# Patient Record
Sex: Female | Born: 1935 | Race: White | Hispanic: No | Marital: Married
Health system: Southern US, Community
[De-identification: ages and names within clinical notes are randomized; demographics above are authoritative.]

---

## 2005-06-16 ENCOUNTER — Ambulatory Visit: Payer: Self-pay | Admitting: Ophthalmology

## 2005-08-06 ENCOUNTER — Ambulatory Visit: Payer: Self-pay | Admitting: Ophthalmology

## 2006-05-05 ENCOUNTER — Ambulatory Visit: Payer: Self-pay | Admitting: Gastroenterology

## 2006-06-18 ENCOUNTER — Ambulatory Visit: Payer: Self-pay | Admitting: Internal Medicine

## 2010-01-20 ENCOUNTER — Emergency Department: Payer: Self-pay | Admitting: Emergency Medicine

## 2010-04-07 ENCOUNTER — Inpatient Hospital Stay: Payer: Self-pay | Admitting: Surgery

## 2010-04-11 ENCOUNTER — Encounter: Payer: Self-pay | Admitting: Internal Medicine

## 2010-05-11 ENCOUNTER — Encounter: Payer: Self-pay | Admitting: Internal Medicine

## 2012-12-21 ENCOUNTER — Ambulatory Visit: Payer: Self-pay

## 2012-12-22 ENCOUNTER — Ambulatory Visit: Payer: Self-pay | Admitting: Orthopedic Surgery

## 2012-12-22 LAB — BASIC METABOLIC PANEL
BUN: 17 mg/dL (ref 7–18)
Chloride: 107 mmol/L (ref 98–107)
Co2: 33 mmol/L — ABNORMAL HIGH (ref 21–32)
Creatinine: 0.7 mg/dL (ref 0.60–1.30)
EGFR (African American): >60
EGFR (Non-African Amer.): >60
Sodium: 142 mmol/L (ref 136–145)

## 2012-12-22 LAB — HEMOGLOBIN: HGB: 15.4 g/dL (ref 12.0–16.0)

## 2012-12-24 ENCOUNTER — Ambulatory Visit: Payer: Self-pay | Admitting: Orthopedic Surgery

## 2013-11-20 ENCOUNTER — Inpatient Hospital Stay: Payer: Self-pay | Admitting: Internal Medicine

## 2013-11-20 LAB — CBC WITH DIFFERENTIAL/PLATELET
Basophil #: 0 10*3/uL (ref 0.0–0.1)
Basophil %: 0.1 %
EOS ABS: 0 10*3/uL (ref 0.0–0.7)
Eosinophil %: 0 %
HCT: 45.6 % (ref 35.0–47.0)
HGB: 14.8 g/dL (ref 12.0–16.0)
LYMPHS ABS: 0.5 10*3/uL — AB (ref 1.0–3.6)
Lymphocyte %: 3.4 %
MCH: 30.7 pg (ref 26.0–34.0)
MCHC: 32.5 g/dL (ref 32.0–36.0)
MCV: 95 fL (ref 80–100)
Monocyte #: 0.6 x10 3/mm (ref 0.2–0.9)
Monocyte %: 4.4 %
NEUTROS PCT: 92.1 %
Neutrophil #: 13.5 10*3/uL — ABNORMAL HIGH (ref 1.4–6.5)
PLATELETS: 184 10*3/uL (ref 150–440)
RBC: 4.82 10*6/uL (ref 3.80–5.20)
RDW: 13.6 % (ref 11.5–14.5)
WBC: 14.7 10*3/uL — ABNORMAL HIGH (ref 3.6–11.0)

## 2013-11-20 LAB — CK-MB
CK-MB: 32.5 ng/mL — ABNORMAL HIGH (ref 0.5–3.6)
CK-MB: 33.3 ng/mL — ABNORMAL HIGH (ref 0.5–3.6)

## 2013-11-20 LAB — URINALYSIS, COMPLETE
Glucose,UR: NEGATIVE mg/dL (ref 0–75)
Hyaline Cast: 2
Leukocyte Esterase: NEGATIVE
NITRITE: NEGATIVE
PH: 5 (ref 4.5–8.0)
RBC,UR: 4 /HPF (ref 0–5)
Specific Gravity: 1.02 (ref 1.003–1.030)
Squamous Epithelial: 2

## 2013-11-20 LAB — PROTIME-INR
INR: 1
Prothrombin Time: 12.8 secs (ref 11.5–14.7)

## 2013-11-20 LAB — APTT
ACTIVATED PTT: 30.7 s (ref 23.6–35.9)
Activated PTT: 74.8 secs — ABNORMAL HIGH (ref 23.6–35.9)

## 2013-11-20 LAB — COMPREHENSIVE METABOLIC PANEL
ALBUMIN: 3.8 g/dL (ref 3.4–5.0)
ALK PHOS: 70 U/L
ALT: 24 U/L (ref 12–78)
ANION GAP: 8 (ref 7–16)
BILIRUBIN TOTAL: 1.4 mg/dL — AB (ref 0.2–1.0)
BUN: 23 mg/dL — ABNORMAL HIGH (ref 7–18)
CALCIUM: 8.6 mg/dL (ref 8.5–10.1)
CREATININE: 1.12 mg/dL (ref 0.60–1.30)
Chloride: 104 mmol/L (ref 98–107)
Co2: 28 mmol/L (ref 21–32)
EGFR (African American): 50 — ABNORMAL LOW
GFR CALC NON AF AMER: 43 — AB
GLUCOSE: 102 mg/dL — AB (ref 65–99)
Osmolality: 283 (ref 275–301)
Potassium: 3.6 mmol/L (ref 3.5–5.1)
SGOT(AST): 73 U/L — ABNORMAL HIGH (ref 15–37)
Sodium: 140 mmol/L (ref 136–145)
TOTAL PROTEIN: 7.7 g/dL (ref 6.4–8.2)

## 2013-11-20 LAB — CK TOTAL AND CKMB (NOT AT ARMC)
CK, TOTAL: 2392 U/L — AB
CK-MB: 31.7 ng/mL — ABNORMAL HIGH (ref 0.5–3.6)

## 2013-11-20 LAB — TROPONIN I
TROPONIN-I: 1.2 ng/mL — AB
TROPONIN-I: 1.8 ng/mL — AB
TROPONIN-I: 2.5 ng/mL — AB

## 2013-11-20 LAB — LIPASE, BLOOD: Lipase: 112 U/L (ref 73–393)

## 2013-11-20 LAB — MAGNESIUM: MAGNESIUM: 2.1 mg/dL

## 2013-11-21 LAB — CK: CK, Total: 2260 U/L — ABNORMAL HIGH

## 2013-11-21 LAB — CBC WITH DIFFERENTIAL/PLATELET
BASOS ABS: 0.1 10*3/uL (ref 0.0–0.1)
BASOS PCT: 0.9 %
EOS ABS: 0 10*3/uL (ref 0.0–0.7)
EOS PCT: 0.3 %
HCT: 39 % (ref 35.0–47.0)
HGB: 13.3 g/dL (ref 12.0–16.0)
Lymphocyte #: 1.4 10*3/uL (ref 1.0–3.6)
Lymphocyte %: 9.7 %
MCH: 31.8 pg (ref 26.0–34.0)
MCHC: 34.1 g/dL (ref 32.0–36.0)
MCV: 93 fL (ref 80–100)
MONOS PCT: 6.3 %
Monocyte #: 0.9 x10 3/mm (ref 0.2–0.9)
Neutrophil #: 11.5 10*3/uL — ABNORMAL HIGH (ref 1.4–6.5)
Neutrophil %: 82.8 %
Platelet: 171 10*3/uL (ref 150–440)
RBC: 4.19 10*6/uL (ref 3.80–5.20)
RDW: 13.7 % (ref 11.5–14.5)
WBC: 13.9 10*3/uL — AB (ref 3.6–11.0)

## 2013-11-21 LAB — BASIC METABOLIC PANEL
ANION GAP: 7 (ref 7–16)
BUN: 25 mg/dL — AB (ref 7–18)
CHLORIDE: 107 mmol/L (ref 98–107)
Calcium, Total: 8 mg/dL — ABNORMAL LOW (ref 8.5–10.1)
Co2: 27 mmol/L (ref 21–32)
Creatinine: 1.17 mg/dL (ref 0.60–1.30)
GFR CALC AF AMER: 47 — AB
GFR CALC NON AF AMER: 41 — AB
GLUCOSE: 98 mg/dL (ref 65–99)
OSMOLALITY: 286 (ref 275–301)
Potassium: 3.7 mmol/L (ref 3.5–5.1)
SODIUM: 141 mmol/L (ref 136–145)

## 2013-11-21 LAB — APTT: ACTIVATED PTT: 77.4 s — AB (ref 23.6–35.9)

## 2013-11-21 LAB — LIPID PANEL
Cholesterol: 154 mg/dL (ref 0–200)
HDL Cholesterol: 45 mg/dL (ref 40–60)
Ldl Cholesterol, Calc: 91 mg/dL (ref 0–100)
TRIGLYCERIDES: 91 mg/dL (ref 0–200)
VLDL Cholesterol, Calc: 18 mg/dL (ref 5–40)

## 2013-11-21 LAB — TSH: THYROID STIMULATING HORM: 1.03 u[IU]/mL

## 2013-11-22 LAB — BASIC METABOLIC PANEL
Anion Gap: 5 — ABNORMAL LOW (ref 7–16)
BUN: 20 mg/dL — AB (ref 7–18)
Calcium, Total: 7.8 mg/dL — ABNORMAL LOW (ref 8.5–10.1)
Chloride: 110 mmol/L — ABNORMAL HIGH (ref 98–107)
Co2: 27 mmol/L (ref 21–32)
Creatinine: 1.1 mg/dL (ref 0.60–1.30)
GFR CALC AF AMER: 51 — AB
GFR CALC NON AF AMER: 44 — AB
GLUCOSE: 103 mg/dL — AB (ref 65–99)
Osmolality: 286 (ref 275–301)
Potassium: 3.6 mmol/L (ref 3.5–5.1)
Sodium: 142 mmol/L (ref 136–145)

## 2013-11-22 LAB — CBC WITH DIFFERENTIAL/PLATELET
Basophil #: 0.1 10*3/uL (ref 0.0–0.1)
Basophil %: 0.5 %
Eosinophil #: 0.2 10*3/uL (ref 0.0–0.7)
Eosinophil %: 1.8 %
HCT: 37.4 % (ref 35.0–47.0)
HGB: 12.3 g/dL (ref 12.0–16.0)
Lymphocyte #: 1.3 10*3/uL (ref 1.0–3.6)
Lymphocyte %: 14.2 %
MCH: 31.3 pg (ref 26.0–34.0)
MCHC: 32.9 g/dL (ref 32.0–36.0)
MCV: 95 fL (ref 80–100)
Monocyte #: 0.8 x10 3/mm (ref 0.2–0.9)
Monocyte %: 9.2 %
NEUTROS ABS: 6.8 10*3/uL — AB (ref 1.4–6.5)
Neutrophil %: 74.3 %
PLATELETS: 142 10*3/uL — AB (ref 150–440)
RBC: 3.93 10*6/uL (ref 3.80–5.20)
RDW: 13.5 % (ref 11.5–14.5)
WBC: 9.2 10*3/uL (ref 3.6–11.0)

## 2013-11-22 LAB — URINE CULTURE

## 2013-11-22 LAB — APTT: Activated PTT: 65.1 secs — ABNORMAL HIGH (ref 23.6–35.9)

## 2013-11-23 LAB — CBC WITH DIFFERENTIAL/PLATELET
BASOS ABS: 0 10*3/uL (ref 0.0–0.1)
Basophil %: 0.5 %
Eosinophil #: 0.2 10*3/uL (ref 0.0–0.7)
Eosinophil %: 2.8 %
HCT: 35.7 % (ref 35.0–47.0)
HGB: 12 g/dL (ref 12.0–16.0)
Lymphocyte #: 0.9 10*3/uL — ABNORMAL LOW (ref 1.0–3.6)
Lymphocyte %: 12.9 %
MCH: 31.9 pg (ref 26.0–34.0)
MCHC: 33.5 g/dL (ref 32.0–36.0)
MCV: 95 fL (ref 80–100)
MONO ABS: 0.7 x10 3/mm (ref 0.2–0.9)
Monocyte %: 9.9 %
NEUTROS PCT: 73.9 %
Neutrophil #: 5.1 10*3/uL (ref 1.4–6.5)
Platelet: 149 10*3/uL — ABNORMAL LOW (ref 150–440)
RBC: 3.76 10*6/uL — ABNORMAL LOW (ref 3.80–5.20)
RDW: 13.7 % (ref 11.5–14.5)
WBC: 7 10*3/uL (ref 3.6–11.0)

## 2013-11-23 LAB — BASIC METABOLIC PANEL
Anion Gap: 4 — ABNORMAL LOW (ref 7–16)
BUN: 16 mg/dL (ref 7–18)
Calcium, Total: 7.9 mg/dL — ABNORMAL LOW (ref 8.5–10.1)
Chloride: 112 mmol/L — ABNORMAL HIGH (ref 98–107)
Co2: 27 mmol/L (ref 21–32)
Creatinine: 0.94 mg/dL (ref 0.60–1.30)
EGFR (African American): >60
GFR CALC NON AF AMER: 53 — AB
GLUCOSE: 96 mg/dL (ref 65–99)
Osmolality: 286 (ref 275–301)
Potassium: 3.6 mmol/L (ref 3.5–5.1)
Sodium: 143 mmol/L (ref 136–145)

## 2013-11-23 LAB — TSH: Thyroid Stimulating Horm: 2.24 u[IU]/mL

## 2013-11-24 ENCOUNTER — Encounter: Payer: Self-pay | Admitting: Internal Medicine

## 2013-11-24 LAB — BASIC METABOLIC PANEL
Anion Gap: 5 — ABNORMAL LOW (ref 7–16)
BUN: 11 mg/dL (ref 7–18)
Calcium, Total: 8 mg/dL — ABNORMAL LOW (ref 8.5–10.1)
Chloride: 111 mmol/L — ABNORMAL HIGH (ref 98–107)
Co2: 28 mmol/L (ref 21–32)
Creatinine: 0.92 mg/dL (ref 0.60–1.30)
EGFR (African American): >60
EGFR (Non-African Amer.): 54 — ABNORMAL LOW
Glucose: 106 mg/dL — ABNORMAL HIGH (ref 65–99)
OSMOLALITY: 287 (ref 275–301)
Potassium: 3.6 mmol/L (ref 3.5–5.1)
SODIUM: 144 mmol/L (ref 136–145)

## 2013-11-24 LAB — CBC WITH DIFFERENTIAL/PLATELET
Basophil #: 0 10*3/uL (ref 0.0–0.1)
Basophil %: 0.6 %
EOS PCT: 3.8 %
Eosinophil #: 0.3 10*3/uL (ref 0.0–0.7)
HCT: 39 % (ref 35.0–47.0)
HGB: 13.1 g/dL (ref 12.0–16.0)
Lymphocyte #: 1.2 10*3/uL (ref 1.0–3.6)
Lymphocyte %: 14.8 %
MCH: 31.7 pg (ref 26.0–34.0)
MCHC: 33.5 g/dL (ref 32.0–36.0)
MCV: 95 fL (ref 80–100)
MONOS PCT: 10.7 %
Monocyte #: 0.8 x10 3/mm (ref 0.2–0.9)
NEUTROS ABS: 5.5 10*3/uL (ref 1.4–6.5)
Neutrophil %: 70.1 %
Platelet: 178 10*3/uL (ref 150–440)
RBC: 4.12 10*6/uL (ref 3.80–5.20)
RDW: 13.6 % (ref 11.5–14.5)
WBC: 7.8 10*3/uL (ref 3.6–11.0)

## 2013-12-09 ENCOUNTER — Encounter: Payer: Self-pay | Admitting: Internal Medicine

## 2014-12-01 NOTE — Op Note (Signed)
PATIENT NAME:  Erin Glover, Erin Glover MR#:  [id number] DATE OF BIRTH:  [birth year]  DATE OF PROCEDURE:  12/24/2012  PREOPERATIVE DIAGNOSIS: T11 compression fracture, acute.   POSTOPERATIVE DIAGNOSIS: T11 compression fracture, acute.   PROCEDURE: T11 biopsy and kyphoplasty.   ANESTHESIA: MAC with local.   SURGEON: Leitha SchullerMichael J. Kinsey Cowsert, MD  DESCRIPTION OF PROCEDURE: The patient was brought to the Operating Room, and after adequate sedation was given, the patient was placed prone, and C-arm was brought in to localize T11 and get good visualization on both AP and lateral projections. After this had been obtained, a timeout procedure was completed, and 5 mL of 1% Xylocaine was infiltrated subcutaneously on the right and left side of the planned approach. The back was then prepped and draped in the usual sterile manner, and again timeout procedure completed. A small stab incision was made on the right side and a trocar inserted down to the lateral portion of the pedicle, and an extra-pedicular access was obtained into the center of the vertebral body. A biopsy was obtained at this point with a nice specimen of vertebral bone obtained from the body. Next, a balloon was inserted and inflated to approximately 3.5 mL. This gave partial correction of a kyphotic deformity. The balloon was then removed, and the central portion of the vertebral body was filled with approximately 4 mL of bone cement, with good fill of the body and no extravasation. The trocar was removed and position checked again to make certain this was T11 that had been addressed, as it was. The wound was then closed with Dermabond and a Band-Aid. The patient was sent to the recovery room in stable condition.   ESTIMATED BLOOD LOSS: Minimal.   COMPLICATIONS: None.   SPECIMEN: T11 vertebral body biopsy.  IMPLANT: Bone cement.    ____________________________ Leitha SchullerMichael J. Darean Rote, MD mjm:OSi D: 12/24/2012 18:20:02 ET T: 12/25/2012 06:28:13  ET JOB#: [id number]  cc: Leitha SchullerMichael J. Deshae Dickison, MD, <Dictator> Leitha SchullerMICHAEL J Georgann Bramble MD ELECTRONICALLY SIGNED 12/27/2012 12:32

## 2014-12-02 NOTE — Discharge Summary (Signed)
Dates of Admission and Diagnosis:  Date of Admission 20-Nov-2013   Date of Discharge 24-Nov-2013   Admitting Diagnosis fall, uti   Final Diagnosis sepsis   Discharge Diagnosis 1 uti   2 rapid atrial fibrillation   3 severe osteoarthritis   4 dehydration    Chief Complaint/History of Present Illness per h and p   Allergies:  Morphine: N/V, Other  Sulfa drugs: Unknown  Thyroid:  13-Apr-15 04:30   Thyroid Stimulating Hormone 1.03 (0.45-4.50 (International Unit)  ----------------------- Pregnant patients have  different reference  ranges for TSH:  - - - - - - - - - -  Pregnant, first trimetser:  0.36 - 2.50 uIU/mL)  15-Apr-15 04:46   Thyroid Stimulating Hormone 2.24 (0.45-4.50 (International Unit)  ----------------------- Pregnant patients have  different reference  ranges for TSH:  - - - - - - - - - -  Pregnant, first trimetser:  0.36 - 2.50 uIU/mL)  Routine Chem:  13-Apr-15 04:30   Glucose, Serum 98  BUN  25  Creatinine (comp) 1.17  Sodium, Serum 141  Potassium, Serum 3.7  Chloride, Serum 107  CO2, Serum 27  Calcium (Total), Serum  8.0  Anion Gap 7  Osmolality (calc) 286  eGFR (African American)  47  eGFR (Non-African American)  41 (eGFR values <28m/min/1.73 m2 may be an indication of chronic kidney disease (CKD). Calculated eGFR is useful in patients with stable renal function. The eGFR calculation will not be reliable in acutely ill patients when serum creatinine is changing rapidly. It is not useful in  patients on dialysis. The eGFR calculation may not be applicable to patients at the low and high extremes of body sizes, pregnant women, and vegetarians.)  Result Comment CK TOTAL - RESULTS VERIFIED BY REPEAT TESTING.  Result(s) reported on 21 Nov 2013 at 05:17AM.  Cholesterol, Serum 154  Triglycerides, Serum 91  HDL (INHOUSE) 45  VLDL Cholesterol Calculated 18  LDL Cholesterol Calculated 91 (Result(s) reported on 21 Nov 2013 at 04:59AM.)   14-Apr-15 04:06   Glucose, Serum  103  BUN  20  Creatinine (comp) 1.10  Sodium, Serum 142  Potassium, Serum 3.6  Chloride, Serum  110  CO2, Serum 27  Calcium (Total), Serum  7.8  Anion Gap  5  Osmolality (calc) 286  eGFR (African American)  51  eGFR (Non-African American)  44 (eGFR values <677mmin/1.73 m2 may be an indication of chronic kidney disease (CKD). Calculated eGFR is useful in patients with stable renal function. The eGFR calculation will not be reliable in acutely ill patients when serum creatinine is changing rapidly. It is not useful in  patients on dialysis. The eGFR calculation may not be applicable to patients at the low and high extremes of body sizes, pregnant women, and vegetarians.)  15-Apr-15 04:46   Glucose, Serum 96  BUN 16  Creatinine (comp) 0.94  Sodium, Serum 143  Potassium, Serum 3.6  Chloride, Serum  112  CO2, Serum 27  Calcium (Total), Serum  7.9  Anion Gap  4  Osmolality (calc) 286  eGFR (African American) >60  eGFR (Non-African American)  53 (eGFR values <6081min/1.73 m2 may be an indication of chronic kidney disease (CKD). Calculated eGFR is useful in patients with stable renal function. The eGFR calculation will not be reliable in acutely ill patients when serum creatinine is changing rapidly. It is not useful in  patients on dialysis. The eGFR calculation may not be applicable to patients at the low and high extremes of body  sizes, pregnant women, and vegetarians.)  Cardiac:  13-Apr-15 04:30   CK, Total  2260 (26-192 NOTE: NEW REFERENCE RANGE  09/12/2013)  Routine Coag:  13-Apr-15 04:30   Activated PTT (APTT)  77.4 (A HCT value >55% may artifactually increase the APTT. In one study, the increase was an average of 19%. Reference: "Effect on Routine and Special Coagulation Testing Values of Citrate Anticoagulant Adjustment in Patients with High HCT Values." American Journal of Clinical Pathology 2006;126:400-405.)  14-Apr-15  04:06   Activated PTT (APTT)  65.1 (A HCT value >55% may artifactually increase the APTT. In one study, the increase was an average of 19%. Reference: "Effect on Routine and Special Coagulation Testing Values of Citrate Anticoagulant Adjustment in Patients with High HCT Values." American Journal of Clinical Pathology 2006;126:400-405.)  Routine Hem:  13-Apr-15 04:30   WBC (CBC)  13.9  RBC (CBC) 4.19  Hemoglobin (CBC) 13.3  Hematocrit (CBC) 39.0  Platelet Count (CBC) 171  MCV 93  MCH 31.8  MCHC 34.1  RDW 13.7  Neutrophil % 82.8  Lymphocyte % 9.7  Monocyte % 6.3  Eosinophil % 0.3  Basophil % 0.9  Neutrophil #  11.5  Lymphocyte # 1.4  Monocyte # 0.9  Eosinophil # 0.0  Basophil # 0.1 (Result(s) reported on 21 Nov 2013 at 04:56AM.)  14-Apr-15 04:06   WBC (CBC) 9.2  RBC (CBC) 3.93  Hemoglobin (CBC) 12.3  Hematocrit (CBC) 37.4  Platelet Count (CBC)  142  MCV 95  MCH 31.3  MCHC 32.9  RDW 13.5  Neutrophil % 74.3  Lymphocyte % 14.2  Monocyte % 9.2  Eosinophil % 1.8  Basophil % 0.5  Neutrophil #  6.8  Lymphocyte # 1.3  Monocyte # 0.8  Eosinophil # 0.2  Basophil # 0.1 (Result(s) reported on 22 Nov 2013 at 04:53AM.)  15-Apr-15 04:46   WBC (CBC) 7.0  RBC (CBC)  3.76  Hemoglobin (CBC) 12.0  Hematocrit (CBC) 35.7  Platelet Count (CBC)  149  MCV 95  MCH 31.9  MCHC 33.5  RDW 13.7  Neutrophil % 73.9  Lymphocyte % 12.9  Monocyte % 9.9  Eosinophil % 2.8  Basophil % 0.5  Neutrophil # 5.1  Lymphocyte #  0.9  Monocyte # 0.7  Eosinophil # 0.2  Basophil # 0.0 (Result(s) reported on 23 Nov 2013 at 05:42AM.)   PERTINENT RADIOLOGY STUDIES: XRay:    12-Apr-15 13:05, Chest Portable Single View  Chest Portable Single View   REASON FOR EXAM:    AMS and fall w/ leukocytosis  COMMENTS:       PROCEDURE: DXR - DXR PORTABLE CHEST SINGLE VIEW  - Nov 20 2013  1:05PM     CLINICAL DATA:  Fall today.    EXAM:  PORTABLE CHEST - 1 VIEW    COMPARISON:   None.    FINDINGS:  Cardiomegaly. The ascending thoracic aorta contour is prominent.  There are no prior studies for comparison. Pulmonary vascularity is  normal. The lungs are normally expanded and clear. Negative for  pleural effusion or pneumothorax. Amorphous calcific density  projecting in the region of the left shoulder/proximal humerus could  be a loose body within the shoulder joint. T12 kyphoplasty changes  are noted.     IMPRESSION:  Prominent ascending aorta contour. This could reflect ascending  aortic aneurysm or ectasia. There are no prior chest studies for  comparison.    The lungs are clear.    Cardiomegaly.  Electronically Signed    By: Curlene Dolphin  M.D.    On: 11/20/2013 13:23         Verified By: Sheppard Evens, M.D.,    12-Apr-15 13:05, Shoulder Right Complete  Shoulder Right Complete   REASON FOR EXAM:    pain after fall  COMMENTS:   Bedside (portable):Y    PROCEDURE: DXR - DXR SHOULDER RIGHT COMPLETE  - Nov 20 2013  1:05PM     CLINICAL DATA:  Right shoulder pain after fall.    EXAM:  DG SHOULDER 3+ VIEWS RIGHT    COMPARISON:  None.    FINDINGS:  No fracture or dislocation is noted. Calcification is seen in region  of greater tuberosity of right proximal humerus consistent with  calcific tendinitis. Degenerative changes seen involving the right  acromioclavicular joint. Visualized ribs appear normal.     IMPRESSION:  Degenerative changes seen involving right acromioclavicular joint.  Probable calcific tendonitis of the supraspinatus tendon.      Electronically Signed    By: Sabino Dick M.D.    On: 11/20/2013 13:24         Verified By: Marveen Reeks, M.D.,    14-Apr-15 15:47, Knee Left Complete  Knee Left Complete   REASON FOR EXAM:    pain  COMMENTS:       PROCEDURE: DXR - DXR KNEE LT COMP WITH OBLIQUES  - Nov 22 2013  3:47PM     CLINICAL DATA:  Fall, lateral edema, pain    EXAM:  LEFT KNEE - COMPLETE 4+  VIEW    COMPARISON:  None    FINDINGS:  Osseous demineralization.  Tricompartmental osteoarthritic changes with joint space narrowing  and spur formation greatest at lateral compartment.    Moderate knee joint effusion.    Mild regional soft tissue swelling.    No acute fracture, dislocation, or bone destruction.     IMPRESSION:  Tricompartmental osteoarthritic changes with knee joint effusion  osseous demineralization.    No definite acute bony findings.  Electronically Signed    By: Lavonia Dana M.D.    On: 11/22/2013 15:59         Verified By: Burnetta Sabin, M.D.,  [place]:    12-Apr-15 17:05, US Carotid Doppler Bilateral  US Carotid Doppler Bilateral   REASON FOR EXAM:    syncope  COMMENTS:       PROCEDURE: [place]  - US CAROTID DOPPLER BILATERAL  - Nov 20 2013  5:05PM     CLINICAL DATA:  Syncope.    EXAM:  BILATERAL CAROTID DUPLEX ULTRASOUND    TECHNIQUE:  Pearline Cables scale imaging, color Doppler and duplex ultrasound were  performed of bilateral carotid and vertebral arteries in the neck.    COMPARISON:  None.  FINDINGS:  Criteria: Quantification of carotid stenosis is based on velocity  parameters that correlate the residual internal carotid diameter  with NASCET-based stenosis levels, using the diameter of the distal  internal carotid lumen as the denominator for stenosis measurement.    The following velocity measurements were obtained:    RIGHT    ICA:  62/10 cm/sec    CCA:  07/86 cm/sec    SYSTOLIC ICA/CCA RATIO:  1.1  DIASTOLIC ICA/CCA RATIO:  1.0    ECA:  76 cm/sec    LEFT    ICA:  62/12 cm/sec    CCA:  75/44 cm/sec    SYSTOLIC ICA/CCA RATIO:  1.0    DIASTOLIC ICA/CCA RATIO:  0.8    ECA:  58 cm/sec  RIGHT CAROTID ARTERY:  Minimal focal calcified plaque is identified  at the level of the distal bulb and ICA origin. Velocities and  spectral Doppler waveforms are unremarkable. Estimated right ICA  stenosis is less than 50%.    RIGHT VERTEBRAL  ARTERY: Antegrade flow with normal waveform and  velocity.    LEFT CAROTID ARTERY: No focal plaque is identified. There is no  evidence of left ICA stenosis.    LEFT VERTEBRAL ARTERY: Antegrade flow with normal waveform and  velocity.     IMPRESSION:  No significant carotid stenosis identified. Estimated right ICA  stenosis is less than 50% with minimal plaque present. There is no  evidence of focal plaque or ICA stenosis on the left.      Electronically Signed    By: Aletta Edouard M.D.    On: 11/20/2013 18:53         Verified By: Azzie Roup, M.D.,  [place]:    12-Apr-15 13:05, Chest Portable Single View  PACS Image     12-Apr-15 14:09, CT ANGIOGRAPHY Chest with for Dissection  PACS Image   CT:    12-Apr-15 13:33, CT Head Without Contrast  CT Head Without Contrast   REASON FOR EXAM:    AMS, syncope  COMMENTS:       PROCEDURE: CT  - CT HEAD WITHOUT CONTRAST  - Nov 20 2013  1:33PM     CLINICAL DATA:  Altered mental status, syncope.    EXAM:  CT HEAD WITHOUT CONTRAST    TECHNIQUE:  Contiguous axial images were obtained from the base of the skull  through the vertex without intravenous contrast.    COMPARISON:  None.  FINDINGS:  Bony calvarium appears intact. Mild diffuse cortical atrophy is  noted. Mild chronic ischemic white matter disease is noted. No mass  effect or midline shift is noted. Ventricular size is within normal  limits. There is no evidence of mass lesion, hemorrhage or acute  infarction.     IMPRESSION:  Mild diffuse cortical atrophy. Mild chronic ischemic white matter  disease. No acute intracranial abnormality seen.      Electronically Signed    By: Sabino Dick M.D.    On: 11/20/2013 13:47     Verified By: Marveen Reeks, M.D.,   Pertinent Past History:  Pertinent Past History per h and p   Hospital Course:  Hospital Course 81 year old female who I used to follow closely who has not followed up with me for years, found  in her yard on the ground Sunday in her yard. She is confused about the events surrounding that and is continued to be weak. Ultimately found to have a uti, bp came up and she was less confused after treatment. Severe left knee pain noted. Finishing cipro. Afib noted on tele, mildly tachy. Starting eliquis with age and elevated bp for stroke prevention   Condition on Discharge Satisfactory   DISCHARGE INSTRUCTIONS HOME MEDS:  Medication Reconciliation: Patient's Home Medications at Discharge:     Medication Instructions  apixaban 5 mg oral tablet  1 tab(s) orally 2 times a day   atorvastatin 20 mg oral tablet  1 tab(s) orally once a day (at bedtime)   metoprolol succinate 25 mg oral tablet, extended release  1 tab(s) orally once a day   ciprofloxacin 500 mg oral tablet  1 tab(s) orally every 12 hours   cyanocobalamin 1000 mcg oral tablet  1 tab(s) orally once a day    STOP TAKING THE FOLLOWING MEDICATION(S):  cipro for a week  Physician's Instructions:  Diet Regular   Activity Limitations As tolerated   Return to Work 1 week   Time frame for Follow Up Appointment 1-2 weeks   Electronic Signatures: Kirk Ruths (MD)  (Signed 16-Apr-15 08:02)  Authored: ADMISSION DATE AND DIAGNOSIS, CHIEF COMPLAINT/HPI, Allergies, PERTINENT LABS, PERTINENT RADIOLOGY STUDIES, PERTINENT PAST HISTORY, HOSPITAL COURSE, DISCHARGE INSTRUCTIONS HOME MEDS, PATIENT INSTRUCTIONS   Last Updated: 16-Apr-15 08:02 by Kirk Ruths (MD)

## 2014-12-02 NOTE — Consult Note (Signed)
PATIENT NAME:  Erin Glover, Erin Glover MR#:  [id number] DATE OF BIRTH:  Apr 26, 1920  DATE OF CONSULTATION:  11/23/2013  REFERRING PHYSICIAN:   CONSULTING PHYSICIAN:  Leitha SchullerMichael J. Ketina Mars, MD  REASON FOR CONSULTATION: Left knee pain after fall.   HISTORY OF PRESENT ILLNESS: The patient is a 81 year old, who suffered a fall in her yard apparently cardiac related. She has a long history of knee osteoarthritis. She goes to [facility] and it has been some time since she has had one. I have seen her in the past for back problems. On examination, she is able to perform straight leg raise. Extensor mechanism intact. There was a large effusion present with valgus deformity. She is able to flex and extend the toes and appears to be neurovascularly intact. X-rays from time of admission show extensive tricompartmental osteoarthritis without evidence of fracture.   CLINICAL IMPRESSION: Severe osteoarthritis with recurrent effusion.   RECOMMENDATION: For aspiration and injection. At this time, she refuses this. If she changes her mind, please contact me and I can aspirate and inject the knee and this may help with her rehab. At this time, she feels like she is not walking enough to need anything done.  ____________________________ Leitha SchullerMichael J. Vance Hochmuth, MD mjm:aw D: 11/23/2013 12:15:31 ET T: 11/23/2013 12:31:09 ET JOB#: [id number]  cc: Leitha SchullerMichael J. Levaughn Puccinelli, MD, <Dictator> Leitha SchullerMICHAEL J Lyna Laningham MD ELECTRONICALLY SIGNED 11/23/2013 17:05

## 2014-12-02 NOTE — Consult Note (Signed)
Brief Consult Note: Diagnosis: left knee pain, osteoarthritris.   Patient was seen by consultant.   Consult note dictated.   Comments: recommend aspiration and injection, patient does not want procedure Call if she chnages her mind.  Electronic Signatures: Leitha SchullerMenz, Dreya Buhrman J (MD)  (Signed 15-Apr-15 12:13)  Authored: Brief Consult Note   Last Updated: 15-Apr-15 12:13 by Leitha SchullerMenz, Naly Schwanz J (MD)

## 2014-12-02 NOTE — H&P (Signed)
PATIENT NAME:  Erin Glover, Erin S MR#:  [id number] DATE OF BIRTH:  15-Oct-1919  DATE OF ADMISSION:  11/20/2013  PRIMARY CARE PHYSICIAN:   Marya AmslerMarshall W. Dareen PianoAnderson, MD   REFERRING PHYSICIAN:  Dr. Fransico SettersForpach  CHIEF COMPLAINT: Larey SeatFell today.  HISTORY OF PRESENT ILLNESS: A 81 year old Caucasian female with a history of UTI and a bladder spasm was sent to ED from home due to a fall today. The patient is weak  and confused, unable to provide detailed information. According to patient's son and the patient's housekeeper, patient was noted to be on the ground in the yard at 10:30 am today. She is confused, complaining of her right shoulder pain but denies any other symptoms. Patient's son and housekeeper do not know how long patient was in the yard.   Patient denies any hay fever or chills. Denies any headache, dizziness or weakness. She denies any chest pain, palpitations. No cough, sputum, or shortness of breath.   The patient was noticed to have a UTI, was treated with Rocephin; however, patient's troponin is elevated at 1.2. She is being treated with a heparin drip.   PAST MEDICAL HISTORY: Bladder spasm and UTI.   SURGICAL HISTORY: Cholecystectomy, appendectomy, oophorectomy,  right hip replacement.   SOCIAL HISTORY: Quit smoking a long time ago.  No alcohol drinking or illicit drugs.   FAMILY HISTORY: Mother had a stroke.   REVIEW OF SYSTEMS: The patient is confused. She denies any symptoms. Review of systems is not reliable.   ALLERGIES:  TO MORPHINE AND SULFA DRUGS.   HOME MEDICATIONS: Centrum Silver 1 tablet p.o. daily, aspirin 81 mg p.o. daily, Aleve sodium 220 mg p.o. every 8 hours p.r.n.   VITAL SIGNS: Temperature 97.7, blood pressure 145/90, pulse 91, respirations 18, oxygen saturation 94% on oxygen.   PHYSICAL EXAMINATION:  GENERAL: The patient is awake, AAO x 2. She is confused, in no acute distress.  HEENT: Pupils round, equal, and reactive to light and accommodation.  Moist oral mucosa.  Clear oropharynx. NECK: Supple. No JVD or carotid bruit. No lymphadenopathy, no thyromegaly.  CARDIOVASCULAR:  S1 and S2, regular rates and rythm. PULMONARY: Bilateral air entry. No wheezing or rales. No use of accessory muscles to breathe.  ABDOMEN: Soft. No distention or tenderness. No organomegaly. Bowel sounds present.  EXTREMITIES: No edema, clubbing, or cyanosis. No calf tenderness. Bilateral pedal pulses present. There are mild bruises on the left knee. NEUROLOGY:  AAO x 2. The patient is confused, but follows commands. No focal deficit. Power 4/5. Sensation intact.   RADIOLOGICAL DATA:  1. CAT scan, CT angio of chest did not show any aortic dissection but showed fusiform aneurysm dilatation of the ascending thoracic aorta measuring 46 mm in diameter, no thoracic aortic dissection. CAT scan of head did not show any acute intracranial abnormality.  2. Chest x-ray shows prominent ascending aorta contour.  3. Right shoulder x-ray showed degenerative changes.  4. EKG shows normal sinus rhythm at 93 BPM with a prolonged QT.   LABORATORY DATA:  Urinalysis showed WBC 9, RBC 4, nitrite negative. CBC showed WBC 14.7, hemoglobin 14.8, platelets 184,000. Glucose 102, BUN 23, creatinine 1.12. Electrolytes are normal. Lipase 112, magnesium 2.1. Troponin 1.2. CK 2392, INR 1.0.   IMPRESSIONS:  1. Non-ST segment elevation myocardial infarction.  2. Urinary tract infection.  3. Acute metabolic encephalopathy.  4. Dehydration.  5. Rhabdomyolysis.  6. Fusiform aneurysmal dilatation of ascending thoracic aorta.   PLAN OF TREATMENT:  1. The patient will be admitted to  telemetry floor. We will continue heparin drip, give aspirin 325 mg p.o. daily, give Lipitor.  We will get a cardiology consult from [facility] cardiologist.  2. For UTI, we will continue Rocephin. Follow up urine culture.  3. For dehydration and rhabdomyolysis, we will start a normal saline IV. Follow up BMP and a CK and start fall and aspiration  precautions. Get a PT consult.  4. For aneurysm, according to Dr. Fransico Setters, he consulted a [facility] thoracic surgeon who suggested followup as outpatient.  5. Discussed with patient's condition and plan of treatment with patient's son and health caregiver. The patient's son wants a full code.   TIME SPENT:  About 63 minutes.    ____________________________ Shaune Pollack, MD qc:dd D: 11/20/2013 16:55:00 ET T: 11/20/2013 18:15:31 ET JOB#: [id number]  cc: Shaune Pollack, MD, <Dictator> Shaune Pollack MD ELECTRONICALLY SIGNED 11/21/2013 10:37

## 2014-12-02 NOTE — Consult Note (Signed)
PATIENT NAME:  Erin Glover, Erin Glover MR#:  [id number] DATE OF BIRTH:  18-Oct-1919  DATE OF CONSULTATION:  11/21/2013  REFERRING PHYSICIAN:  Dr. Judithann SheenSparks CONSULTING PHYSICIAN:  Lamar BlinksBruce J. Jerid Catherman, MD  REASON FOR CONSULTATION: Syncope, subendocardial myocardial infarction and urinary tract infection with dehydration.   CHIEF COMPLAINT: The patient is unable to converse well due to somnolence.   HISTORY OF PRESENT ILLNESS: This is a 81 year old female with no apparent previous coronary artery disease, who has had an episode of urinary tract infection with dehydration causing a syncopal episode for which the patient was on the floor for quite some time and some concerns of rhabdomyolysis. Troponin elevation is 2.5 and she has no current acute myocardial infarction, although there are concerns due to this acute ischemic event. The patient has had no evidence of current symptoms and is resting comfortably, hemodynamically stable.   REVIEW OF SYSTEMS: Remainder of review of systems cannot be assessed due to inability for conversation.   PAST MEDICAL HISTORY:  1.  Hypertension.  2.  Hyperlipidemia.   FAMILY HISTORY: No apparent family members with early onset of cardiovascular disease or hypertension.   SOCIAL HISTORY: The patient has no known tobacco alcohol use.   ALLERGIES: As listed.   MEDICATIONS: As listed.   PHYSICAL EXAMINATION:  VITAL SIGNS: Blood pressure is 110/68 bilaterally, heart rate is 72 upright, reclining, and regular.  GENERAL: She is a well-appearing elderly female in no acute distress.  HEENT: No icterus, thyromegaly, ulcers, hemorrhage, or xanthelasma.  CARDIOVASCULAR: Regular rate and rhythm. Normal S1 and S2 with a 2 to 3 out of 6 right upper sternal border murmur, nonradiating. PMI is diffuse. Carotid upstroke normal without bruit. Jugular venous pressure is normal.  LUNGS: Bibasilar crackles with normal respirations.  ABDOMEN: Soft, nontender, without hepatosplenomegaly or  masses. Abdominal aorta is normal size without bruit.  EXTREMITIES: 2+ radial, femoral, dorsal pedal pulses, with trace lower extremity edema. No cyanosis, clubbing or ulcers.  NEUROLOGIC: She is not oriented. She is not able to converse very well.  ASSESSMENT: A 81 year old female with acute urinary tract infection, dehydration, syncope and subendocardial myocardial infarction.   RECOMMENDATIONS:  1.  Continue aspirin for subendocardial myocardial infarction likely secondary to urinary tract infection and dehydration and not due to acute coronary syndrome.  2.  Avoid any beta blocker or other antihypertensives unless the patient continues to have higher blood pressure after rehydration.  3.  Echocardiogram for LV systolic dysfunction, valvular heart disease and assess extent of myocardial infarction and LV dysfunction contributing to above.  4.  Telemetry for telemetry unit assessment for possible rhythm disturbances causing syncope other than current issues of dehydration and urinary tract infection.  5.  Ambulation and possible discharge to home thereafter.  ____________________________ Lamar BlinksBruce J. Amish Mintzer, MD bjk:aw D: 11/21/2013 07:26:35 ET T: 11/21/2013 07:40:01 ET JOB#: [id number]  cc: Lamar BlinksBruce J. Ayad Nieman, MD, <Dictator> Lamar BlinksBRUCE J Honour Schwieger MD ELECTRONICALLY SIGNED 11/22/2013 7:50

## 2014-12-02 NOTE — Op Note (Signed)
PATIENT NAME:  Erin Glover, Erin Glover DATE OF BIRTH:  [birth year]  DATE OF PROCEDURE:  11/24/2013  PREOPERATIVE DIAGNOSIS: Osteoarthritis, left knee.   POSTOPERATIVE DIAGNOSIS: Osteoarthritis, left knee.   PROCEDURE: Aspiration and injection, left knee.   ANESTHESIA: Local.   SURGEON: Kennedy BuckerMichael Countess Biebel, MD.   DESCRIPTION OF PROCEDURE:  After informed consent had been obtained from the patient, the lateral leg was prepped with Betadine and 18-gauge needle inserted in a superolateral portal 60 mL of bloody fluid was withdrawn and the knee was then injected with 40 mg Kenalog 1 mL and 5 mL of 0.5% Marcaine without epinephrine. The needle was withdrawn and a Band-Aid applied. The patient tolerated the procedure well. There were no complications. No specimen. No blood loss.    ____________________________ Leitha SchullerMichael J. Pauleen Goleman, MD mjm:dd D: 11/24/2013 16:23:00 ET T: 11/25/2013 00:18:17 ET JOB#: [id number]  cc: Leitha SchullerMichael J. Kasee Hantz, MD, <Dictator> Leitha SchullerMICHAEL J Ashtynn Berke MD ELECTRONICALLY SIGNED 11/28/2013 7:20

## 2015-07-31 IMAGING — CT CT ANGIO AORTIC DISSECTION W/ CM
3 of 6 series · 5 of 16 positions shown, 6 images · IV contrast (APPLIED)
Comparison: DG CHEST 1V PORT dated 11/20/2013

CLINICAL DATA: Syncopal episode, abnormal chest radiograph
concerning for possible ascending thoracic aortic aneurysm

EXAM:
CT ANGIOGRAPHY CHEST WITH CONTRAST
TECHNIQUE: Multidetector CT imaging of the chest was performed using the
standard protocol during bolus administration of intravenous
contrast. Multiplanar CT image reconstructions and MIPs were
obtained to evaluate the vascular anatomy.
CONTRAST:  70 cc Isovue 370

[Series 6: arterial · axial · arterial · 0.68mm/px · z∈[-652,-342]mm · 3 of 156 slices shown, 4 images]
[im 1/156  soft-tissue]
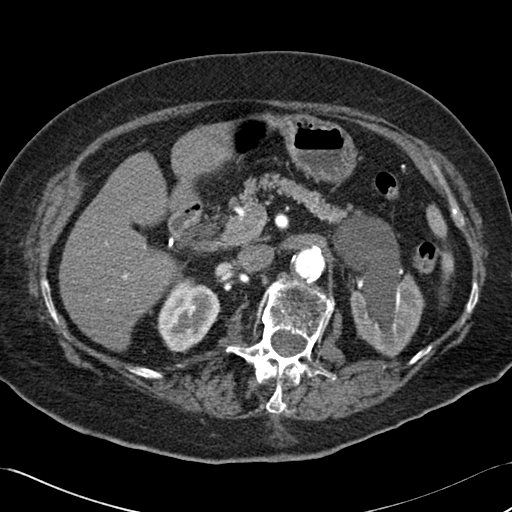
[im 1/156  bone]
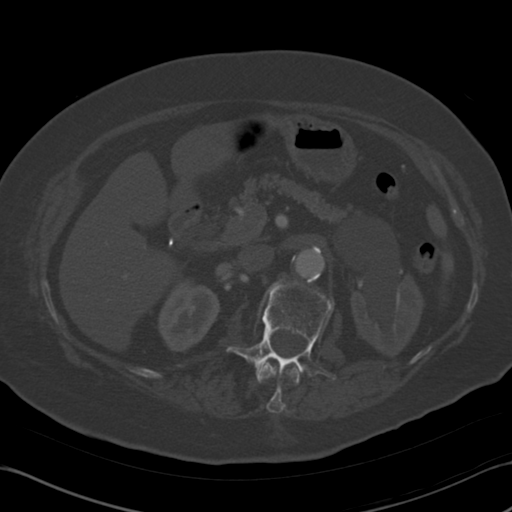
[im 78/156  soft-tissue]
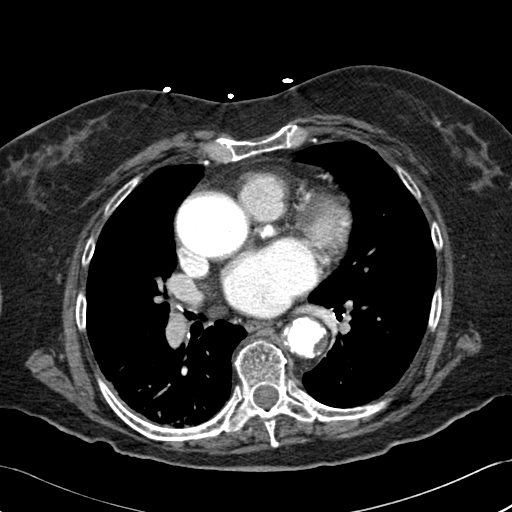
[im 156/156  soft-tissue]
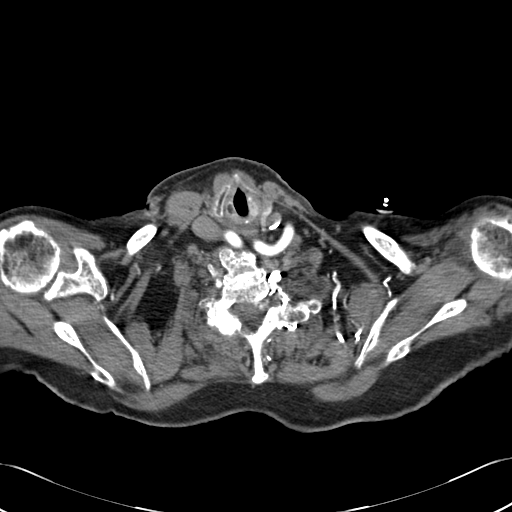

[Series 8: cor arterial mpr · coronal · arterial · 0.64mm/px · 1 of 135 slices shown]
[im 68/135  soft-tissue]
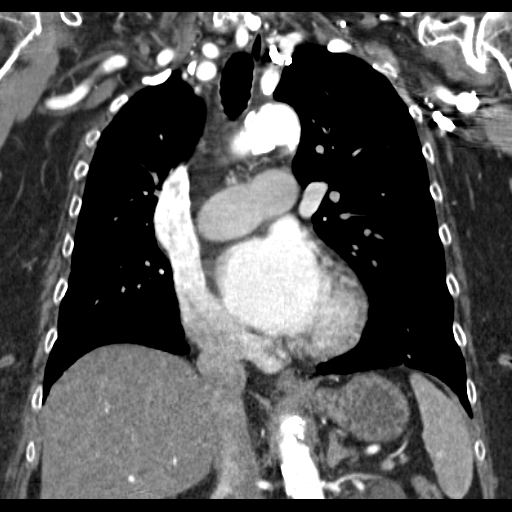

[Series 9: sag arterial mpr · sagittal · arterial · 0.54mm/px · 1 of 165 slices shown]
[im 83/165  soft-tissue]
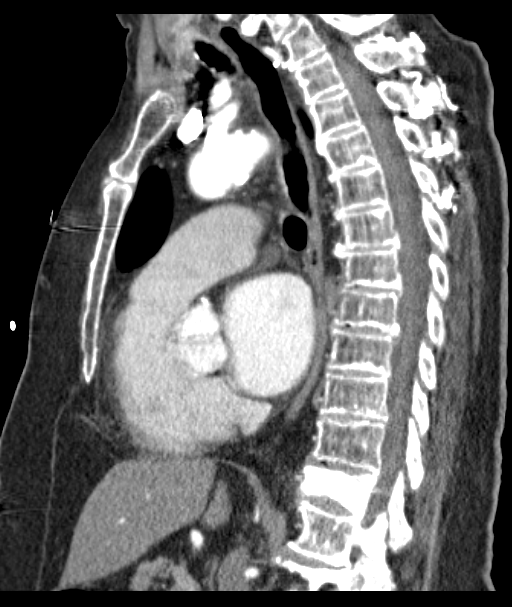

[5 of 16 positions shown; findings below may reference images not displayed]

FINDINGS: Vascular Findings:

There is fusiform aneurysmal dilatation of the ascending thoracic
aorta with measurements as follows. The thoracic aorta tapers to a
normal caliber at the level of the aortic arch. Incidental note is
made of a small ductus diverticulum.

Conventional configuration of the aortic arch. There is
atherosclerotic plaque involving the origin and proximal aspect of
the major branch vessel of the aortic arch, in particular, the left
subclavian artery, not resulting in hemodynamically significant
stenosis. The major branch vessels of the aortic arch widely patent
throughout their imaged course, although the bilateral common
carotid arteries are noted to be tortuous and course posterior to
the trachea.

Scattered calcified and noncalcified atherosclerotic plaque within a
tortuous but normal caliber descending thoracic aorta. There is a
minimal amount of slightly ulcerated noncalcified intraluminal
atherosclerotic plaque involving the mid aspect of the descending
thoracic aorta (representative axial image 83, series 6; sagittal
image 102, series 9), not resulting in a hemodynamically significant
stenosis.

Review of the precontrast images are negative for intramural
hematoma formation. No thoracic aortic dissection or periaortic
stranding.

Cardiomegaly. Coronary artery calcifications. No pericardial
effusion. Calcifications within the aortic root.

Although this examination was not tailored for the evaluation of the
pulmonary arteries, there are no discrete filling defects within the
central pulmonary arterial tree to suggest central pulmonary
embolism. Borderline enlarged caliber the main pulmonary artery
measuring 32 mm in diameter.

-------------------------------------------------------------

Thoracic aortic measurements:

Sinotubular junction

33 mm as measured in greatest oblique coronal dimension.

Proximal ascending aorta

45 mm as measured in greatest oblique axial dimension at the level
of the main pulmonary artery an approximately 46 mm in greatest
oblique coronal dimension (coronal image 55, series 10).

Aortic arch aorta

26 mm as measured in greatest oblique sagittal dimension.

Proximal descending thoracic aorta

27 mm as measured in greatest oblique axial dimension at the level
of the main pulmonary artery.

Distal descending thoracic aorta

27 mm as measured in greatest oblique axial dimension at the level
of the diaphragmatic hiatus.

Review of the MIP images confirms the above findings.

-------------------------------------------------------------

Non-Vascular Findings:

There is minimal bibasilar dependent subpleural ground-glass
atelectasis, right greater than left. No focal airspace opacities.
No pleural effusion or pneumothorax. The central pulmonary airways
are widely patent. No discrete pulmonary nodules. Scattered shotty
mediastinal and hilar lymph nodes individually not enlarged by size
criteria. No mediastinal, hilar or axillary lymphadenopathy.

Limited early arterial phase evaluation of the upper abdomen
demonstrates diffuse decreased attenuation of the hepatic parenchyma
suggestive of hepatic steatosis. There is moderate dilatation of the
left renal pelvis, incompletely imaged.

No acute or aggressive osseus abnormalities. Post T12
vertebroplasty/kyphoplasty.
IMPRESSION: 1. Fusiform aneurysmal dilatation of the ascending thoracic aorta
measuring 46 mm in diameter. No thoracic aortic dissection or
periaortic stranding.
2. Scattered calcified and noncalcified atherosclerotic plaque
within a mildly tortuous but normal caliber descending thoracic
aorta. Note, there is a focal area of noncalcified slightly
ulcerated intraluminal plaque within the mid aspect of the
descending thoracic aorta, not resulting in a hemodynamically
significant stenosis.
3. Cardiomegaly.  Coronary artery calcifications.
4. Borderline enlarged caliber of the main pulmonary artery,
nonspecific though could be seen in the setting of pulmonary
arterial hypertension. Further evaluation of cardiac echo could be
performed as clinically indicated.
5. At least moderate dilatation of the left renal pelvis,
incompletely imaged, and as such, the etiology of which is not
depicted on this examination. Clinical correlation is advised.
Further evaluation may be performed with renal ultrasound or
abdominal CT as clinically indicated.

## 2015-07-31 IMAGING — CR DG CHEST 1V PORT
1 series · 1 of 1 positions shown · non-contrast
Comparison: None.

CLINICAL DATA: Fall today.

EXAM:
PORTABLE CHEST - 1 VIEW

[ap]
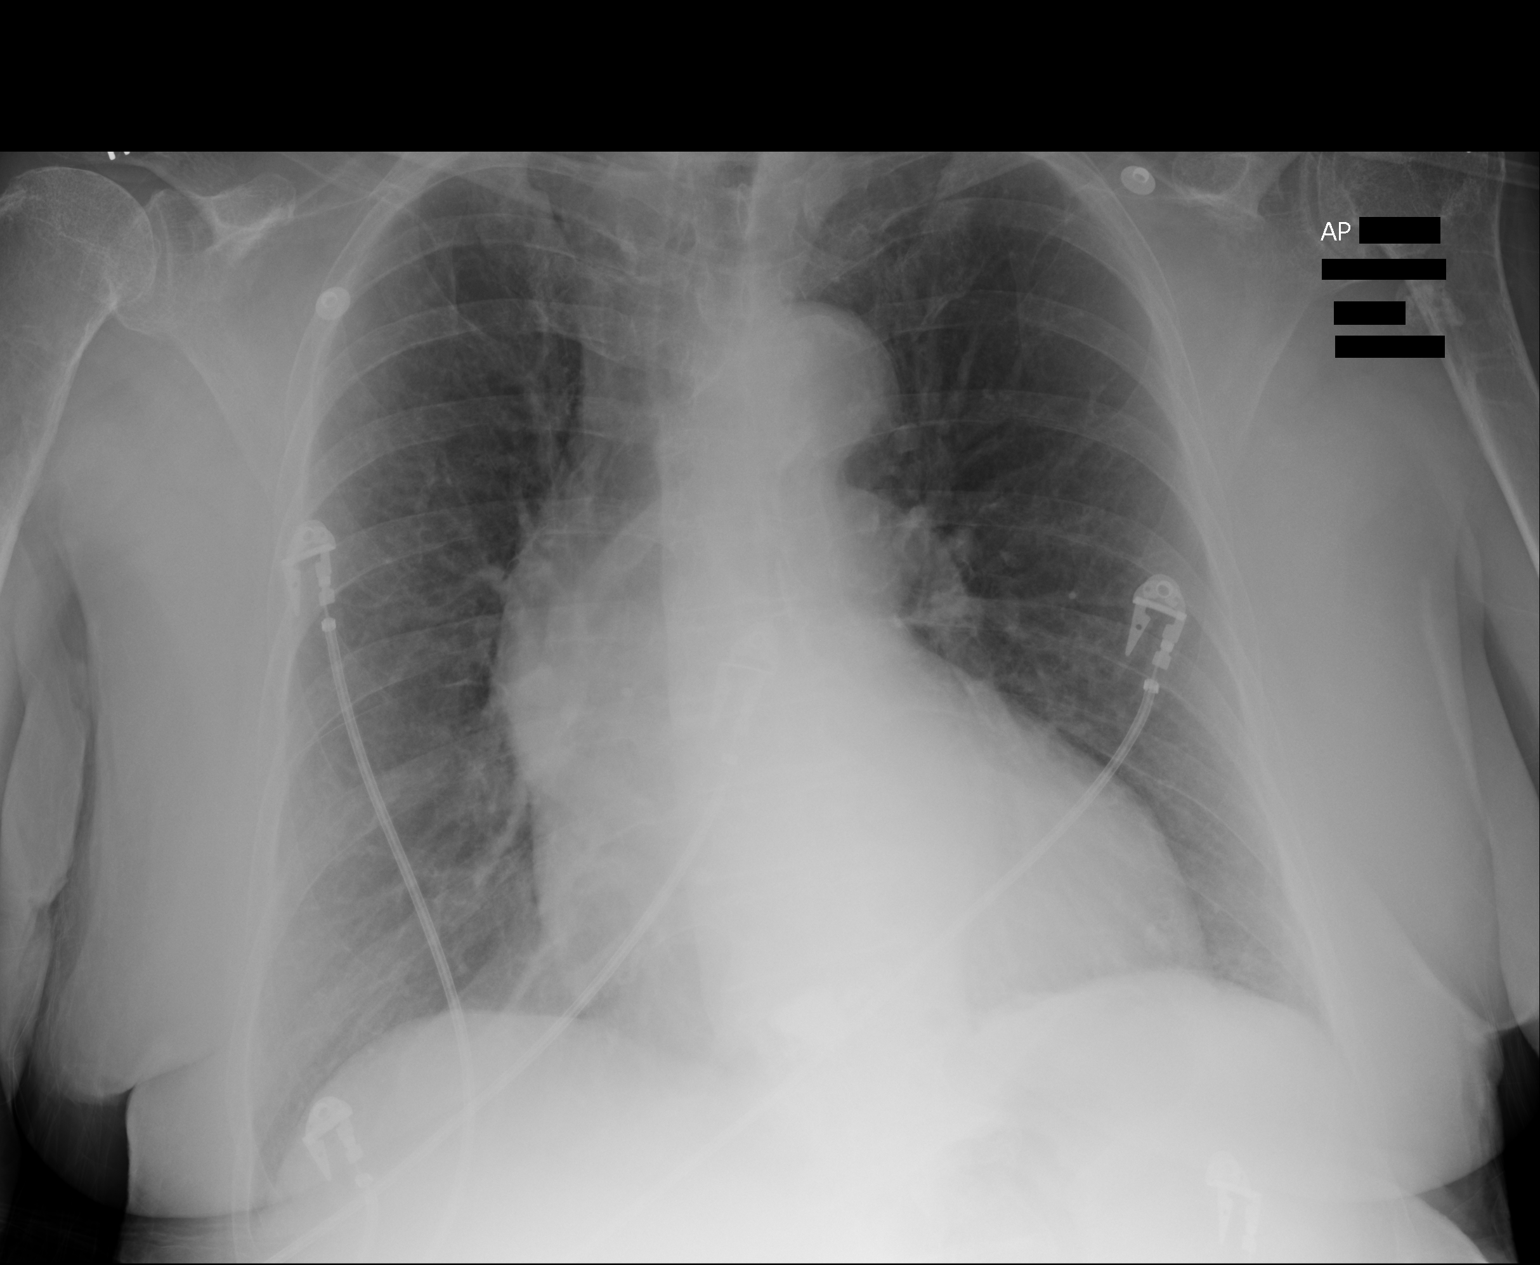

[1 of 1 positions shown; findings below may reference images not displayed]

FINDINGS: Cardiomegaly. The ascending thoracic aorta contour is prominent.
There are no prior studies for comparison. Pulmonary vascularity is
normal. The lungs are normally expanded and clear. Negative for
pleural effusion or pneumothorax. Amorphous calcific density
projecting in the region of the left shoulder/proximal humerus could
be a loose body within the shoulder joint. T12 kyphoplasty changes
are noted.
IMPRESSION: Prominent ascending aorta contour. This could reflect ascending
aortic aneurysm or ectasia. There are no prior chest studies for
comparison.

The lungs are clear.

Cardiomegaly.

## 2015-08-02 IMAGING — CR DG KNEE COMPLETE 4+V*L*
1 series · 4 of 4 positions shown · non-contrast
Comparison: None

CLINICAL DATA: Fall, lateral edema, pain

EXAM:
LEFT KNEE - COMPLETE 4+ VIEW

[Series 1: ap · 0.17mm/px · 4 of 4 slices shown]
[im 1/4]
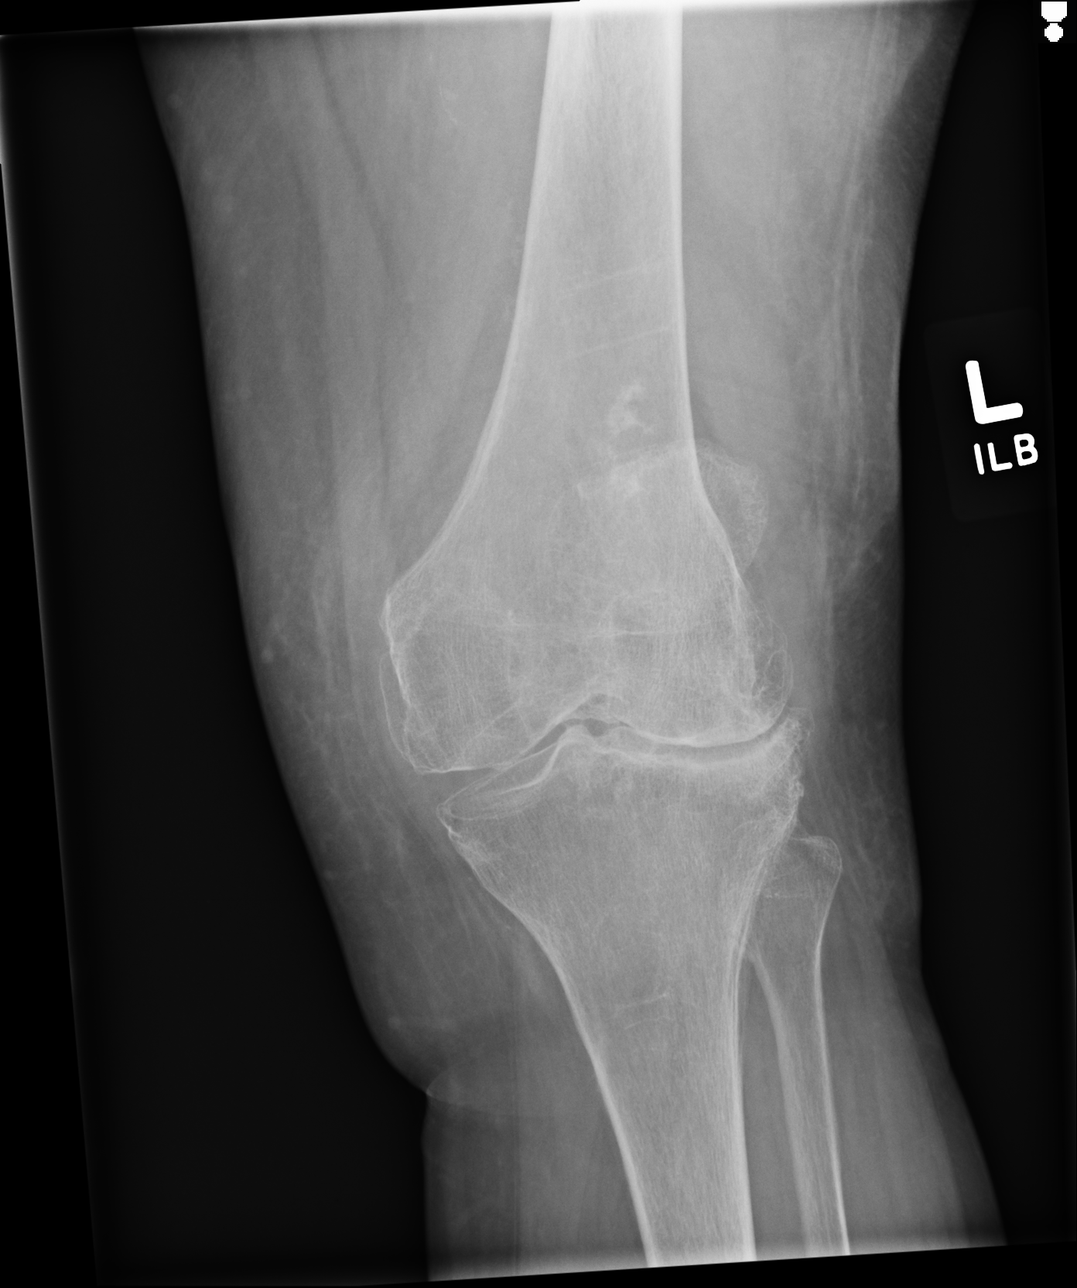
[im 2/4]
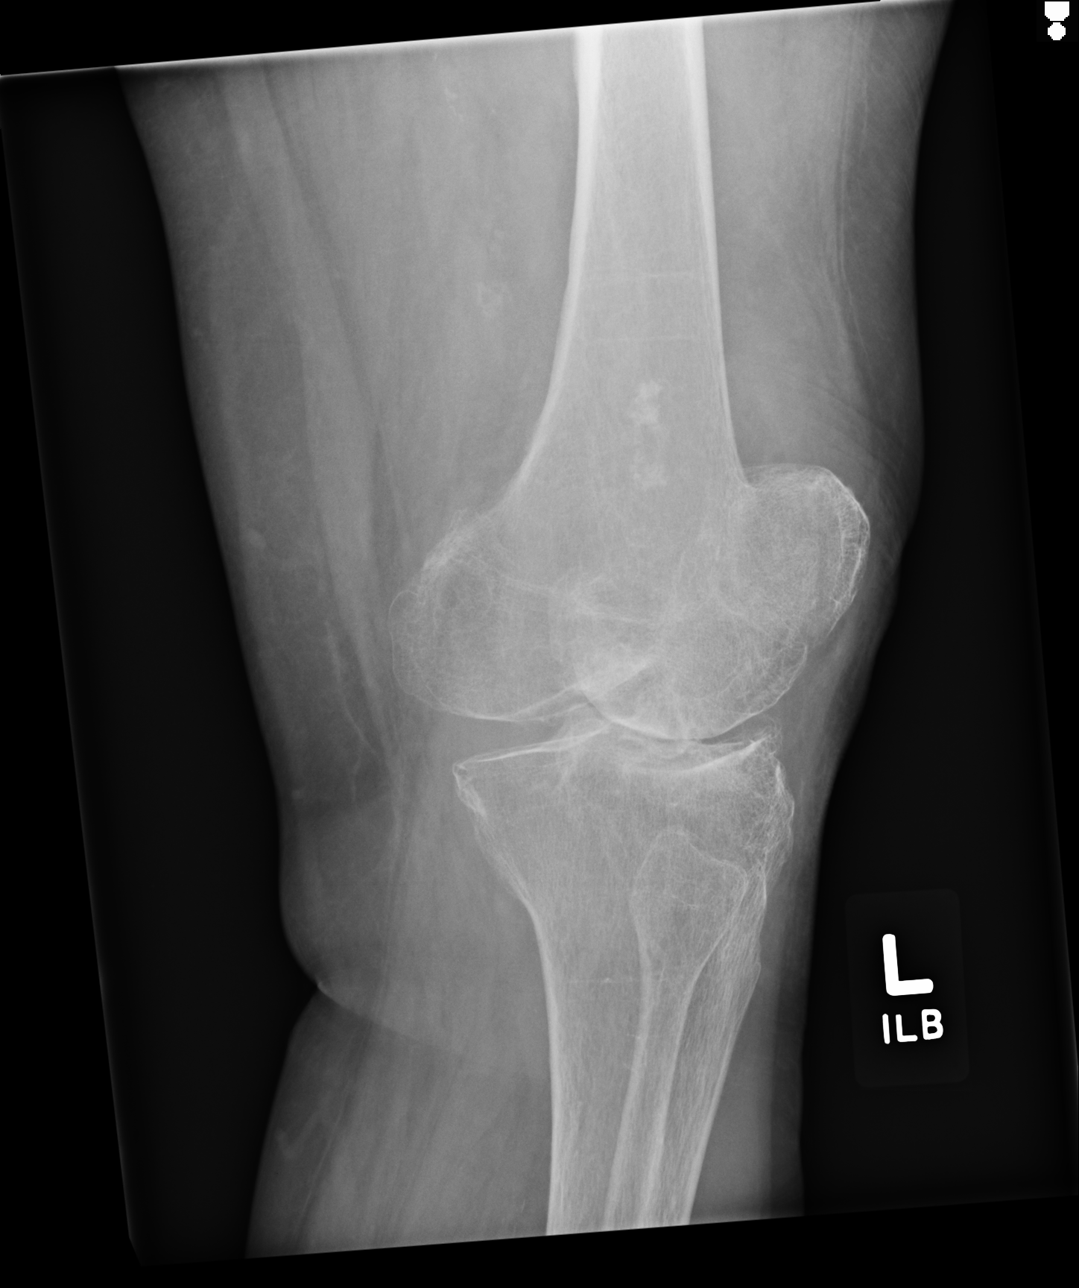
[im 3/4]
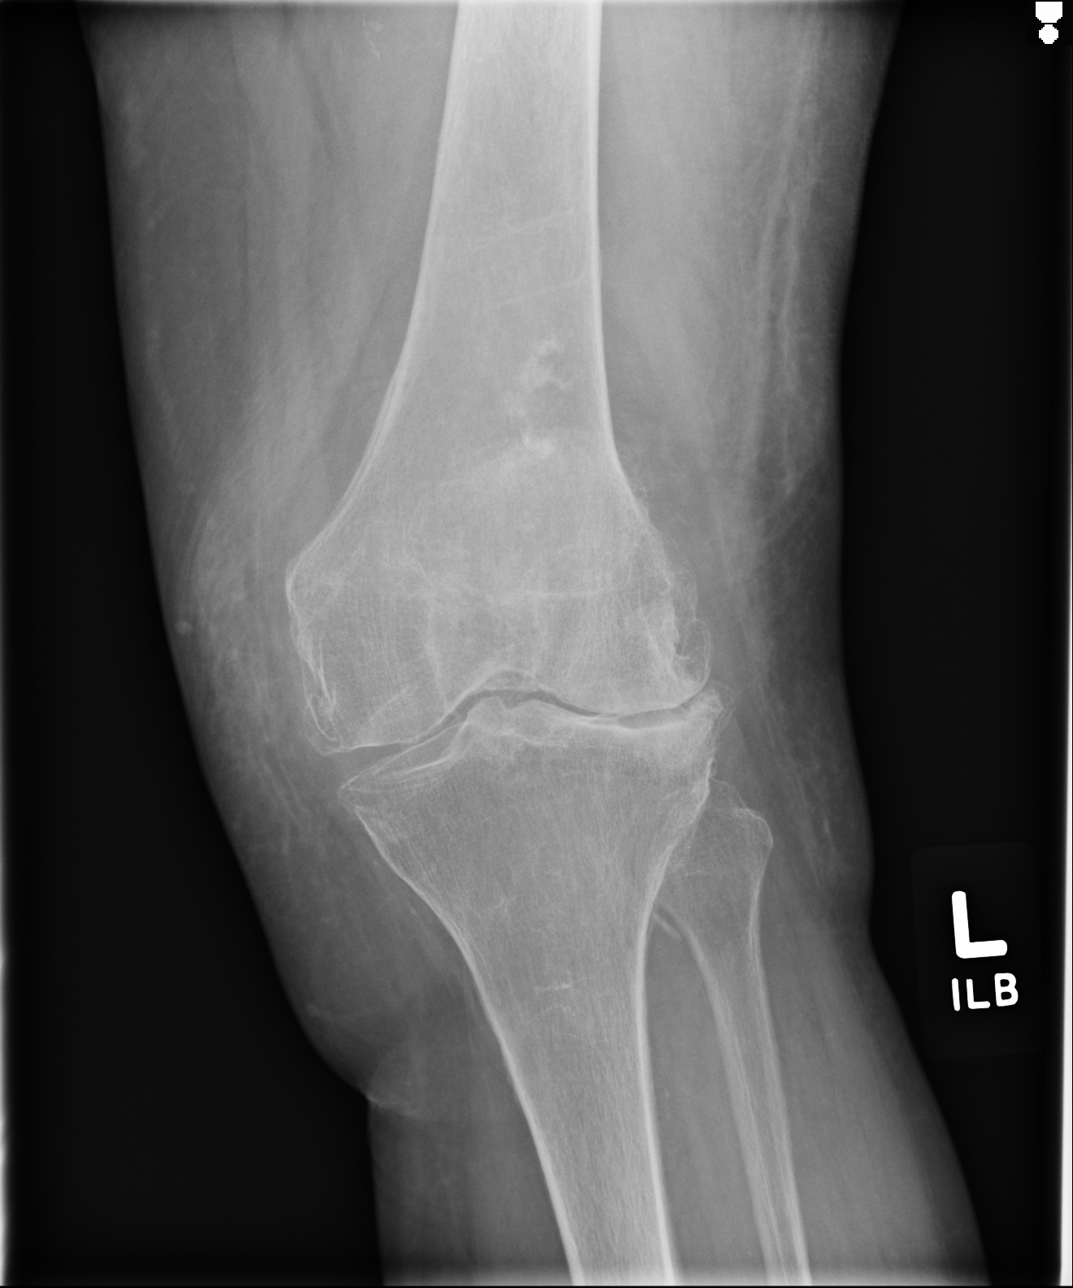
[im 4/4]
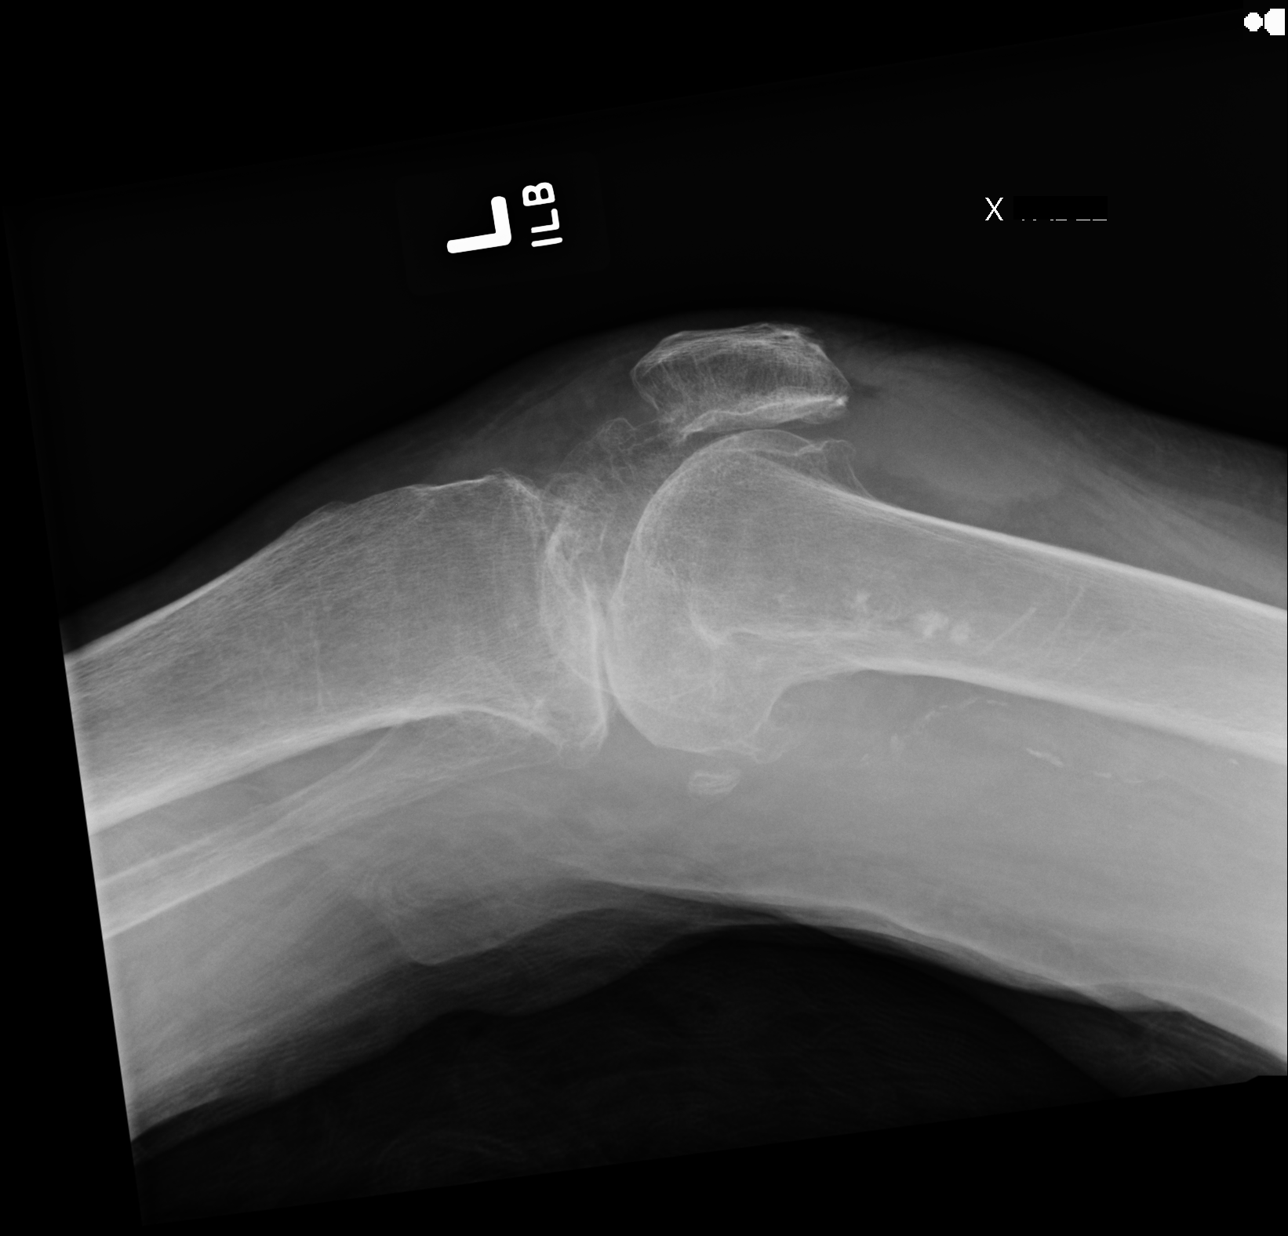

[4 of 4 positions shown; findings below may reference images not displayed]

FINDINGS: Osseous demineralization.

Tricompartmental osteoarthritic changes with joint space narrowing
and spur formation greatest at lateral compartment.

Moderate knee joint effusion.

Mild regional soft tissue swelling.

No acute fracture, dislocation, or bone destruction.
IMPRESSION: Tricompartmental osteoarthritic changes with knee joint effusion
osseous demineralization.

No definite acute bony findings.

## 2021-09-11 DEATH — deceased
